# Patient Record
Sex: Female | Born: 1980 | Race: White | Hispanic: No | Marital: Single
Health system: Southern US, Community
[De-identification: ages and names within clinical notes are randomized; demographics above are authoritative.]

---

## 2020-11-10 ENCOUNTER — Emergency Department (HOSPITAL_COMMUNITY)
Admission: EM | Admit: 2020-11-10 | Discharge: 2020-11-11 | Disposition: A | Discharge status: Home / Self Care | Payer: Self-pay | Attending: Emergency Medicine | Admitting: Emergency Medicine

## 2020-11-10 ENCOUNTER — Emergency Department (HOSPITAL_COMMUNITY): Payer: Self-pay

## 2020-11-10 DIAGNOSIS — U071 COVID-19: Secondary | ICD-10-CM | POA: Insufficient documentation

## 2020-11-10 DIAGNOSIS — R739 Hyperglycemia, unspecified: Secondary | ICD-10-CM | POA: Insufficient documentation

## 2020-11-10 LAB — CBC WITH DIFFERENTIAL/PLATELET
Abs Immature Granulocytes: 0.02 10*3/uL (ref 0.00–0.07)
Basophils Absolute: 0 10*3/uL (ref 0.0–0.1)
Basophils Relative: 0 %
Eosinophils Absolute: 0.1 10*3/uL (ref 0.0–0.5)
Eosinophils Relative: 1 %
HCT: 41 % (ref 36.0–46.0)
Hemoglobin: 13.5 g/dL (ref 12.0–15.0)
Immature Granulocytes: 0 %
Lymphocytes Relative: 33 %
Lymphs Abs: 2.1 10*3/uL (ref 0.7–4.0)
MCH: 30.3 pg (ref 26.0–34.0)
MCHC: 32.9 g/dL (ref 30.0–36.0)
MCV: 91.9 fL (ref 80.0–100.0)
Monocytes Absolute: 0.5 10*3/uL (ref 0.1–1.0)
Monocytes Relative: 7 %
Neutro Abs: 3.7 10*3/uL (ref 1.7–7.7)
Neutrophils Relative %: 59 %
Platelets: 280 10*3/uL (ref 150–400)
RBC: 4.46 MIL/uL (ref 3.87–5.11)
RDW: 12.6 % (ref 11.5–15.5)
WBC: 6.4 10*3/uL (ref 4.0–10.5)
nRBC: 0 % (ref 0.0–0.2)

## 2020-11-10 MED ORDER — BENZONATATE 100 MG PO CAPS
200.0000 mg | ORAL_CAPSULE | Freq: Once | ORAL | Status: AC
  Administered 2020-11-10: 200 mg via ORAL
  Filled 2020-11-10: qty 2

## 2020-11-10 MED ORDER — IBUPROFEN 800 MG PO TABS
800.0000 mg | ORAL_TABLET | Freq: Once | ORAL | Status: AC
  Administered 2020-11-10: 800 mg via ORAL
  Filled 2020-11-10: qty 1

## 2020-11-10 NOTE — ED Provider Notes (Signed)
[facility] EMERGENCY DEPARTMENT Provider Note   CSN: [id number] Arrival date & time: 11/10/20  1431     History Chief Complaint  Patient presents with  . Cough    Julia Mata is a 40 y.o. female.  The history is provided by the patient and medical records.    40 y.o. F with hx of DM1, presenting to the ED for concern for covid-19.  Patient recently traveled here from [place] for work related meetings and trainings.  States the first few days she was here she felt fine but colleague tested + for covid last Monday.  She did work mandated daily home testing and hers came up positive on Thursday.  States the only symptoms she has is bad nasal congestion, dry throat, and cough.  She has never had any fever, chills, sweats, nausea, vomiting, diarrhea, loss of taste/smell, etc.  States she wonders if she has some other virus that was positive on the home test.  She admits to some SOB in the morning but thinks this is mostly related to her nasal congestion.  She is not having any active chest pain.  Her blood sugars have been a little off but this is not uncommon for her when acutely ill.  She has tried OTC tylenol cold medication without change.  She has been vaccinated for covid-19.  No past medical history on file.  There are no problems to display for this patient.     OB History   No obstetric history on file.     No family history on file.     Home Medications Prior to Admission medications   Not on File    Allergies    Patient has no known allergies.  Review of Systems   Review of Systems  HENT: Positive for congestion and sore throat.   Respiratory: Positive for cough.   All other systems reviewed and are negative.   Physical Exam Updated Vital Signs BP 114/68   Pulse 81   Temp 98.1 F (36.7 C) (Oral)   Resp 20   LMP 10/16/2020   SpO2 98%   Physical Exam Vitals and nursing note reviewed.  Constitutional:      Appearance: She is  well-developed.     Comments: Overall appears well  HENT:     Head: Normocephalic and atraumatic.  Eyes:     Conjunctiva/sclera: Conjunctivae normal.     Pupils: Pupils are equal, round, and reactive to light.  Cardiovascular:     Rate and Rhythm: Normal rate and regular rhythm.     Heart sounds: Normal heart sounds.  Pulmonary:     Effort: Pulmonary effort is normal. No respiratory distress.     Breath sounds: Normal breath sounds. No rhonchi.     Comments: Lung sounds are clear, able to speak in full sentences without difficulty, O2 sats 100% on RA Abdominal:     General: Bowel sounds are normal.     Palpations: Abdomen is soft.  Musculoskeletal:        General: Normal range of motion.     Cervical back: Normal range of motion.  Skin:    General: Skin is warm and dry.  Neurological:     Mental Status: She is alert and oriented to person, place, and time.     ED Results / Procedures / Treatments   Labs (all labs ordered are listed, but only abnormal results are displayed) Labs Reviewed  RESP PANEL BY RT-PCR (FLU A&B, COVID) [id number] -  Abnormal; Notable for the following components:      Result Value   SARS Coronavirus 2 by RT PCR POSITIVE (*)    All other components within normal limits  BASIC METABOLIC PANEL - Abnormal; Notable for the following components:   Sodium 130 (*)    Chloride 97 (*)    CO2 21 (*)    Glucose, Bld 469 (*)    Calcium 8.7 (*)    All other components within normal limits  CBG MONITORING, ED - Abnormal; Notable for the following components:   Glucose-Capillary 181 (*)    All other components within normal limits  CBC WITH DIFFERENTIAL/PLATELET    EKG None  Radiology DG Chest 2 View  Result Date: 11/10/2020 CLINICAL DATA:  COVID worsening cough EXAM: CHEST - 2 VIEW COMPARISON:  None. FINDINGS: The heart size and mediastinal contours are within normal limits. Both lungs are clear. The visualized skeletal structures are unremarkable. IMPRESSION:  No active cardiopulmonary disease. Electronically Signed   By: Jasmine Pang M.D.   On: 11/10/2020 19:29    Procedures Procedures   Medications Ordered in ED Medications  benzonatate (TESSALON) capsule 200 mg (200 mg Oral Given 11/10/20 2309)  ibuprofen (ADVIL) tablet 800 mg (800 mg Oral Given 11/10/20 2308)  sodium chloride 0.9 % bolus 2,000 mL (0 mLs Intravenous Stopped 11/11/20 0221)  insulin aspart (novoLOG) injection 6 Units (6 Units Intravenous Given 11/11/20 0117)    ED Course  I have reviewed the triage vital signs and the nursing notes.  Pertinent labs & imaging results that were available during my care of the patient were reviewed by me and considered in my medical decision making (see chart for details).    MDM Rules/Calculators/A&P  40 y.o. female presenting to the ED with persistent URI type symptoms.  States she had a home Covid test that was positive on Thursday after exposure to sick coworker.  States she has never had any fever or shortness of breath and is concerned she may not actually have Covid.  She has tried over-the-counter medications without relief.  She is afebrile and nontoxic in appearance here.  Her lungs are clear and she is speaking in paragraphs without difficulty.  Her oxygen saturation is 100% on room air.  Chest x-ray was obtained without any acute infiltrates noted.  She is a type I diabetic and has noticed some fluctuations in her glucose recently.  Labs were obtained here and she does have glucose of 469 but essentially normal bicarb and normal anion gap of 12.  Clinically not DKA, however as she is DM 1, will try and aggressively treat to prevent development of DKA.  Given IVF and insulin.  Formal PCR test pending.  1:13 AM  notified that patient has taken 6 units of her own insulin (novolog).  She was specifically told not to do this when I spoke with her earlier about starting IVF and insulin.  States she did it because her headache was "too bad".   Advised not to take any further medications on her own as this can be dangerous.  Will adjust to 6 units additional insulin to make total of 12 originally ordered.  After fluids and insulin, CBG down to 181.  Her PCR test is positive.  Patient was given results, aware of quarantine precautions.  Will continue to stay well-hydrated at home and monitor her glucose closely.  Continue daily insulin.  She did have improvement of cough with tessalon here so script given for same.  She may return here for any new or acute changes until she is able to return home to [place].  Final Clinical Impression(s) / ED Diagnoses Final diagnoses:  COVID-19  Hyperglycemia    Rx / DC Orders ED Discharge Orders         Ordered    benzonatate (TESSALON) 100 MG capsule  Every 8 hours        11/11/20 0214           Garlon Hatchet, PA-C 11/11/20 0235    Cathren Laine, MD 11/12/20 [phone]

## 2020-11-10 NOTE — ED Triage Notes (Signed)
Emergency Medicine Provider Triage Evaluation Note  Shineka Auble , a 40 y.o. female  was evaluated in triage.  Pt complains of pain.  Diagnosed with COVID last week.  She has had intermittent headaches, congestion, rhinorrhea, sore throat.  She has an active cough of yellow sputum.  Intermittently has shortness of air however currently.  She is tolerating p.o. intake at home.  She is concerned due to her worsening cough.  He is here for a work trip.  She normally resides in [place].  No hemoptysis, lateral leg swelling, redness or warmth  Review of Systems  Positive: Headache, cough, sore throat Negative: Fever, chills, emesis, chest pain, abdominal pain  Physical Exam  BP (!) 142/87 (BP Location: Left Arm)   Pulse 88   Temp 99.4 F (37.4 C) (Oral)   Resp 18   SpO2 99%  Gen:   Awake, no distress   HEENT:  Atraumatic  Resp:  Normal effort  Cardiac:  Normal rate  Abd:   Nondistended, nontender  MSK:   Moves extremities without difficulty  Neuro:  Speech clear   Medical Decision Making  Medically screening exam initiated at 3:03 PM.  Appropriate orders placed.  Deionna Marcantonio was informed that the remainder of the evaluation will be completed by another provider, this initial triage assessment does not replace that evaluation, and the importance of remaining in the ED until their evaluation is complete.  Clinical Impression  COVID, cough, HA   Nelma Phagan A, PA-C 11/10/20 1505

## 2020-11-10 NOTE — ED Triage Notes (Signed)
Stated + covid test last Wednesday and URI symptoms are unresolved with OTC meds, stated she has hx of DM I,

## 2020-11-11 LAB — BASIC METABOLIC PANEL
Anion gap: 12 (ref 5–15)
BUN: 17 mg/dL (ref 6–20)
CO2: 21 mmol/L — ABNORMAL LOW (ref 22–32)
Calcium: 8.7 mg/dL — ABNORMAL LOW (ref 8.9–10.3)
Chloride: 97 mmol/L — ABNORMAL LOW (ref 98–111)
Creatinine, Ser: 0.91 mg/dL (ref 0.44–1.00)
GFR, Estimated: >60 mL/min (ref >60)
Glucose, Bld: 469 mg/dL — ABNORMAL HIGH (ref 70–99)
Potassium: 4.4 mmol/L (ref 3.5–5.1)
Sodium: 130 mmol/L — ABNORMAL LOW (ref 135–145)

## 2020-11-11 LAB — CBG MONITORING, ED: Glucose-Capillary: 181 mg/dL — ABNORMAL HIGH (ref 70–99)

## 2020-11-11 LAB — RESP PANEL BY RT-PCR (FLU A&B, COVID) ARPGX2
Influenza A by PCR: NEGATIVE
Influenza B by PCR: NEGATIVE
SARS Coronavirus 2 by RT PCR: POSITIVE — AB

## 2020-11-11 MED ORDER — INSULIN ASPART 100 UNIT/ML ~~LOC~~ SOLN: 12.0000 [IU] | Freq: Once | SUBCUTANEOUS | Status: DC

## 2020-11-11 MED ORDER — BENZONATATE 100 MG PO CAPS: 100.0000 mg | ORAL_CAPSULE | Freq: Three times a day (TID) | ORAL | 0 refills | Status: AC

## 2020-11-11 MED ORDER — SODIUM CHLORIDE 0.9 % IV BOLUS
2000.0000 mL | Freq: Once | INTRAVENOUS | Status: AC
  Administered 2020-11-11: 2000 mL via INTRAVENOUS

## 2020-11-11 MED ORDER — INSULIN ASPART 100 UNIT/ML ~~LOC~~ SOLN
6.0000 [IU] | Freq: Once | SUBCUTANEOUS | Status: AC
  Administered 2020-11-11: 6 [IU] via INTRAVENOUS

## 2020-11-11 NOTE — Discharge Instructions (Addendum)
Your covid PCR test was positive.  Chest x-ray without covid pneumonia though.  Can use the tessalon for cough.  Can continue tylenol or motrin for headaches/bodyaches or if fever develops. Try to make sure and stay hydrated.  Keep close check on your glucose. Return here for any new/acute changes.

## 2020-11-11 NOTE — ED Notes (Signed)
Dc instructions reviewed with the pt.  PT verbalized understanding.  PT DC.  

## 2022-07-28 IMAGING — DX DG CHEST 2V
2 series · 2 of 2 positions shown · non-contrast
Comparison: None.

CLINICAL DATA: COVID worsening cough

EXAM:
CHEST - 2 VIEW

[chest pa]
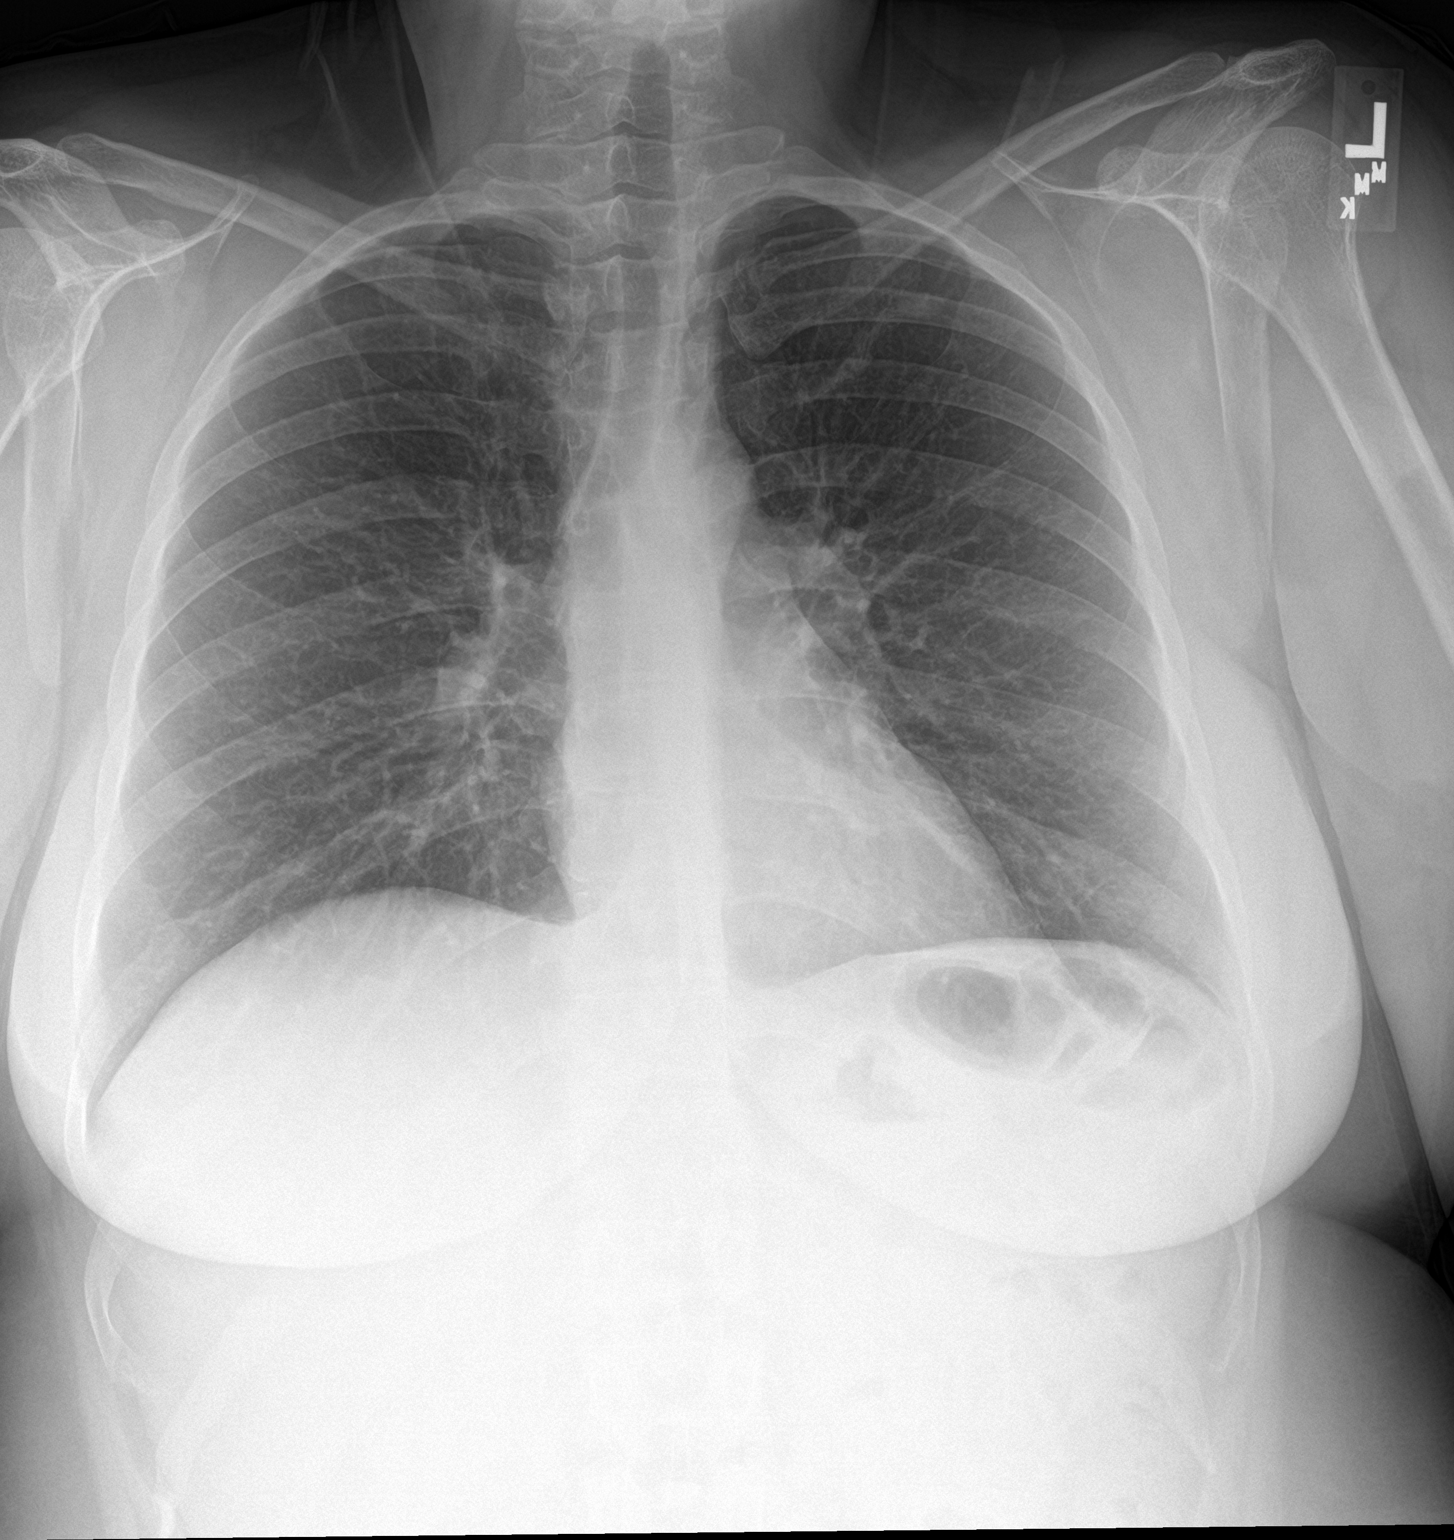

[chest lat]
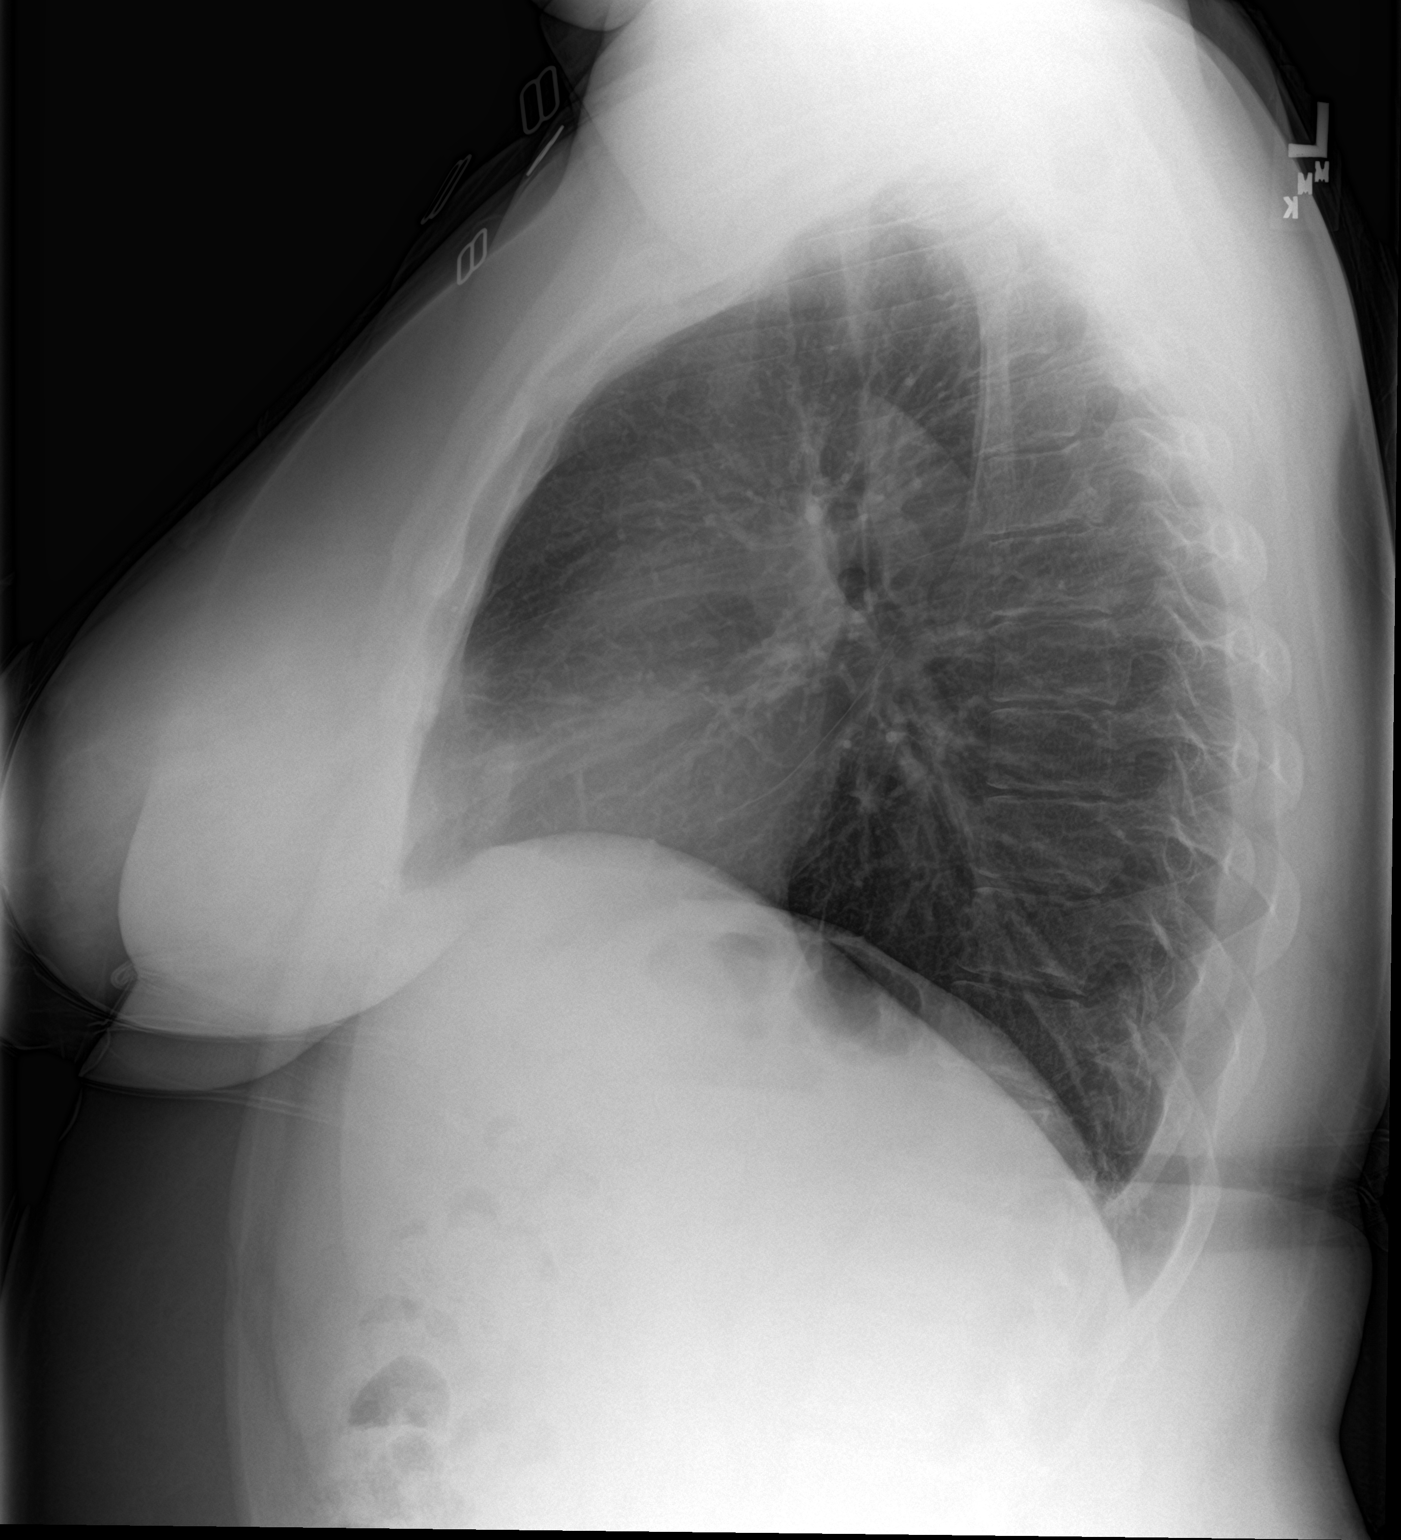

[2 of 2 positions shown; findings below may reference images not displayed]

FINDINGS: The heart size and mediastinal contours are within normal limits.
Both lungs are clear. The visualized skeletal structures are
unremarkable.
IMPRESSION: No active cardiopulmonary disease.
